# Patient Record
Sex: Male | Born: 1970 | Race: Black or African American | Hispanic: No | Marital: Single | Smoking: Current some day smoker
Health system: Southern US, Community
[De-identification: ages and names within clinical notes are randomized; demographics above are authoritative.]

## PROBLEM LIST (undated history)

## (undated) DIAGNOSIS — E7800 Pure hypercholesterolemia, unspecified: Secondary | ICD-10-CM

## (undated) DIAGNOSIS — I10 Essential (primary) hypertension: Secondary | ICD-10-CM

## (undated) HISTORY — PX: SHOULDER ARTHROSCOPY WITH OPEN ROTATOR CUFF REPAIR: SHX6092

---

## 2010-01-30 ENCOUNTER — Inpatient Hospital Stay (HOSPITAL_COMMUNITY): Admission: EM | Admit: 2010-01-30 | Discharge: 2010-02-01 | Payer: Self-pay | Admitting: Emergency Medicine

## 2010-09-07 IMAGING — CR DG CHEST 2V
2 series · 2 of 2 positions shown · non-contrast
Comparison: None.

CLINICAL DATA: Preoperative respiratory film.

CHEST - 2 VIEW

[w chest pa]
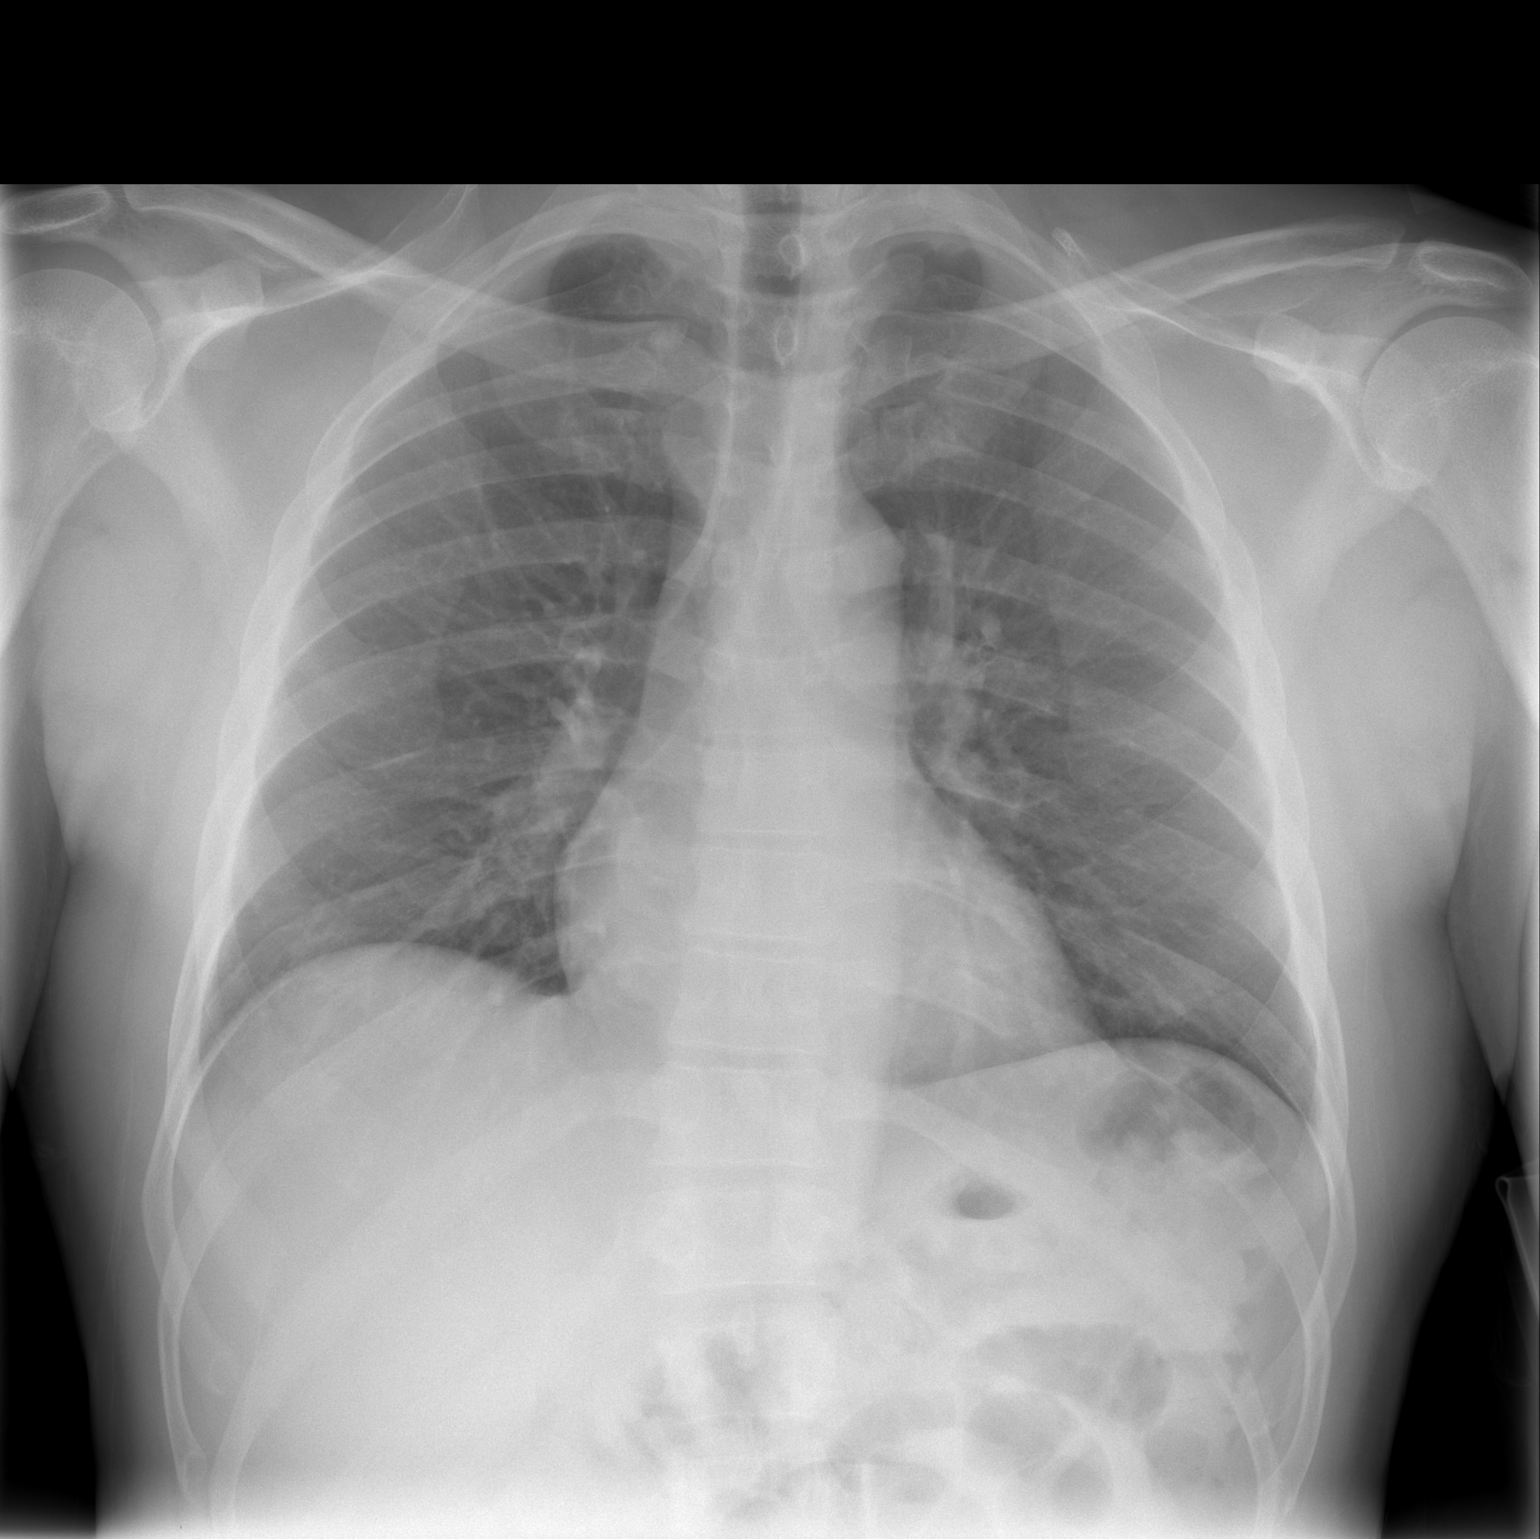

[w chest lat]
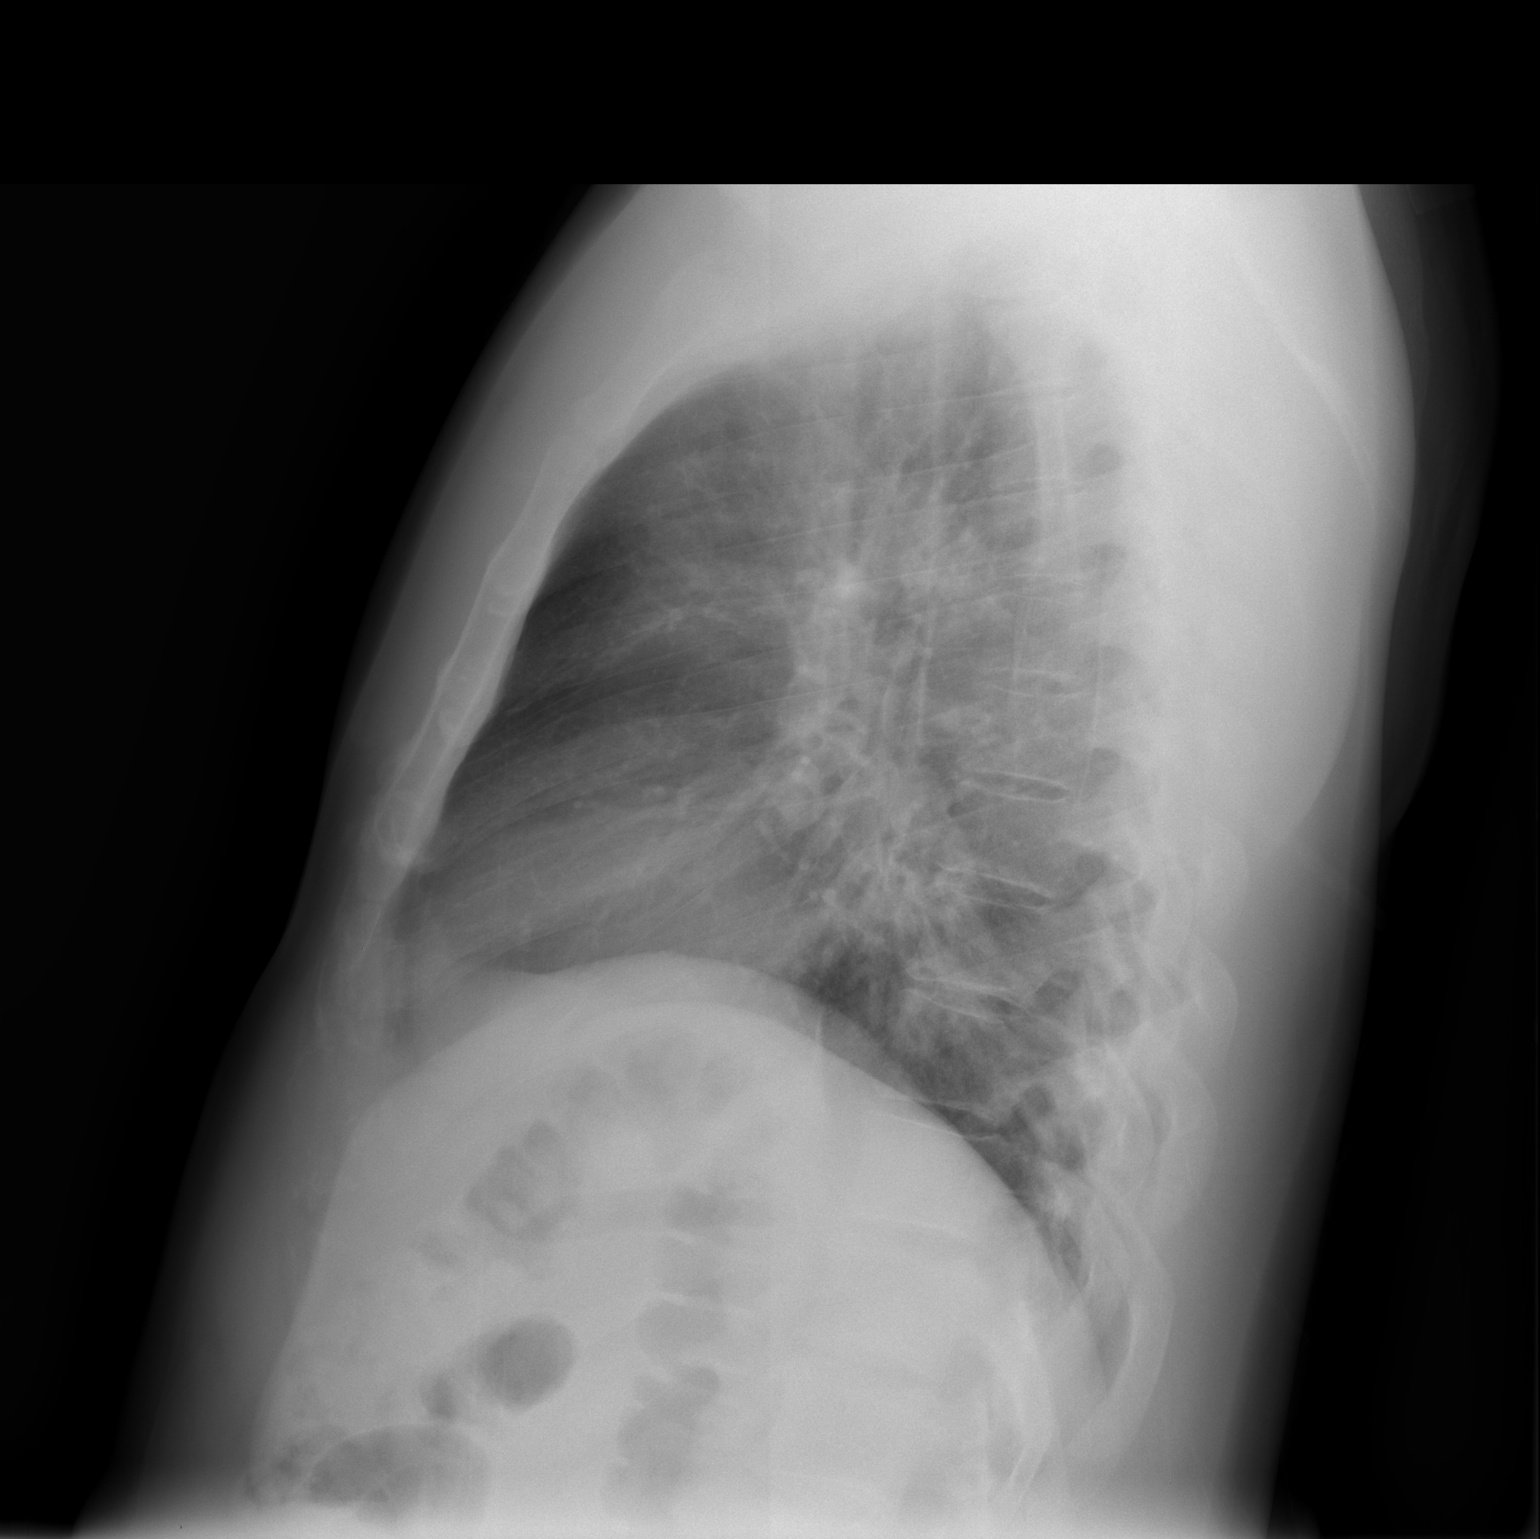

[2 of 2 positions shown; findings below may reference images not displayed]

FINDINGS: Lungs clear.  No pleural effusion.  Heart size normal.
IMPRESSION: No acute disease.

## 2011-02-23 LAB — URINALYSIS, ROUTINE W REFLEX MICROSCOPIC
Bilirubin Urine: NEGATIVE
Glucose, UA: NEGATIVE mg/dL
Hgb urine dipstick: NEGATIVE
Ketones, ur: NEGATIVE mg/dL
Nitrite: NEGATIVE
Protein, ur: NEGATIVE mg/dL
Specific Gravity, Urine: 1.013 (ref 1.005–1.030)
Urobilinogen, UA: 1 mg/dL (ref 0.0–1.0)
pH: 6.5 (ref 5.0–8.0)

## 2011-02-23 LAB — DIFFERENTIAL
Basophils Absolute: 0 10*3/uL (ref 0.0–0.1)
Eosinophils Absolute: 0.2 10*3/uL (ref 0.0–0.7)
Monocytes Absolute: 0.7 10*3/uL (ref 0.1–1.0)
Neutro Abs: 3.8 10*3/uL (ref 1.7–7.7)
Neutrophils Relative %: 53 % (ref 43–77)

## 2011-02-23 LAB — CBC
HCT: 38.8 % — ABNORMAL LOW (ref 39.0–52.0)
Hemoglobin: 13 g/dL (ref 13.0–17.0)
MCHC: 33.6 g/dL (ref 30.0–36.0)
MCV: 90.1 fL (ref 78.0–100.0)
RBC: 4.31 MIL/uL (ref 4.22–5.81)
RDW: 13.9 % (ref 11.5–15.5)

## 2011-02-23 LAB — BASIC METABOLIC PANEL
Calcium: 8.8 mg/dL (ref 8.4–10.5)
Creatinine, Ser: 1.1 mg/dL (ref 0.4–1.5)
GFR calc Af Amer: >60 mL/min (ref >60)

## 2022-09-01 DIAGNOSIS — I639 Cerebral infarction, unspecified: Secondary | ICD-10-CM

## 2022-09-01 HISTORY — DX: Cerebral infarction, unspecified: I63.9

## 2022-09-19 ENCOUNTER — Other Ambulatory Visit: Payer: Self-pay

## 2022-09-19 ENCOUNTER — Encounter (HOSPITAL_COMMUNITY): Payer: Self-pay

## 2022-09-19 DIAGNOSIS — I639 Cerebral infarction, unspecified: Secondary | ICD-10-CM | POA: Diagnosis not present

## 2022-09-19 DIAGNOSIS — R209 Unspecified disturbances of skin sensation: Secondary | ICD-10-CM | POA: Diagnosis not present

## 2022-09-19 DIAGNOSIS — M79605 Pain in left leg: Secondary | ICD-10-CM | POA: Diagnosis present

## 2022-09-19 DIAGNOSIS — Z5321 Procedure and treatment not carried out due to patient leaving prior to being seen by health care provider: Secondary | ICD-10-CM | POA: Insufficient documentation

## 2022-09-19 NOTE — ED Triage Notes (Signed)
Pt reports left leg pain. Pt reports that his back calf is numb. This has been ongoing since his stroke on 9/29.Pt suppose to f/u but have not been able to get an appt. Pt states "my left side just hurting."

## 2022-09-20 ENCOUNTER — Emergency Department (HOSPITAL_COMMUNITY)
Admission: EM | Admit: 2022-09-20 | Discharge: 2022-09-20 | Discharge status: Against Medical Advice | Payer: BC Managed Care – PPO | Attending: Emergency Medicine | Admitting: Emergency Medicine

## 2022-09-20 HISTORY — DX: Pure hypercholesterolemia, unspecified: E78.00

## 2022-09-20 HISTORY — DX: Essential (primary) hypertension: I10

## 2022-10-16 ENCOUNTER — Ambulatory Visit: Payer: BC Managed Care – PPO | Admitting: Internal Medicine

## 2022-10-16 ENCOUNTER — Encounter: Payer: Self-pay | Admitting: Internal Medicine

## 2022-10-16 VITALS — BP 135/93 | HR 92 | Ht 72.0 in | Wt 203.2 lb

## 2022-10-16 DIAGNOSIS — E782 Mixed hyperlipidemia: Secondary | ICD-10-CM

## 2022-10-16 DIAGNOSIS — I469 Cardiac arrest, cause unspecified: Secondary | ICD-10-CM

## 2022-10-16 DIAGNOSIS — I10 Essential (primary) hypertension: Secondary | ICD-10-CM | POA: Insufficient documentation

## 2022-10-16 DIAGNOSIS — I639 Cerebral infarction, unspecified: Secondary | ICD-10-CM | POA: Insufficient documentation
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
## 2022-11-01 ENCOUNTER — Encounter: Payer: Self-pay | Admitting: Neurology

## 2022-11-01 ENCOUNTER — Ambulatory Visit (INDEPENDENT_AMBULATORY_CARE_PROVIDER_SITE_OTHER): Payer: BC Managed Care – PPO | Admitting: Neurology

## 2022-11-01 VITALS — BP 133/93 | HR 91 | Ht 72.0 in | Wt 205.3 lb

## 2022-11-01 DIAGNOSIS — M792 Neuralgia and neuritis, unspecified: Secondary | ICD-10-CM | POA: Diagnosis not present

## 2022-11-01 DIAGNOSIS — M21372 Foot drop, left foot: Secondary | ICD-10-CM

## 2022-11-01 DIAGNOSIS — I63542 Cerebral infarction due to unspecified occlusion or stenosis of left cerebellar artery: Secondary | ICD-10-CM

## 2022-11-01 MED ORDER — ASPIRIN 81 MG PO CHEW: 81.0000 mg | CHEWABLE_TABLET | Freq: Every morning | ORAL | 3 refills | Status: AC

## 2022-11-01 MED ORDER — LIDOCAINE 3 % EX CREA: TOPICAL_CREAM | CUTANEOUS | 0 refills | Status: DC
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
## 2022-11-01 NOTE — Patient Instructions (Signed)
Continue current medication including aspirin and high-dose statin Continue with duloxetine and gabapentin for neuropathic pain Continue with physical therapy Trial of lidocaine cream EMG nerve conduction study of the left lower extremity Follow-up in 6 months or sooner if worse

## 2022-11-02 ENCOUNTER — Ambulatory Visit: Payer: BC Managed Care – PPO

## 2022-11-02 DIAGNOSIS — I469 Cardiac arrest, cause unspecified: Secondary | ICD-10-CM

## 2022-11-02 DIAGNOSIS — I10 Essential (primary) hypertension: Secondary | ICD-10-CM

## 2022-11-02 DIAGNOSIS — I639 Cerebral infarction, unspecified: Secondary | ICD-10-CM

## 2022-11-14 ENCOUNTER — Ambulatory Visit: Payer: BC Managed Care – PPO | Admitting: Internal Medicine

## 2022-11-14 ENCOUNTER — Ambulatory Visit: Payer: Self-pay | Admitting: Internal Medicine

## 2022-11-22 ENCOUNTER — Ambulatory Visit: Payer: BC Managed Care – PPO | Admitting: Internal Medicine

## 2022-11-22 ENCOUNTER — Encounter: Payer: Self-pay | Admitting: Internal Medicine

## 2022-12-06 ENCOUNTER — Other Ambulatory Visit: Payer: Self-pay | Admitting: Neurology

## 2022-12-25 NOTE — Progress Notes (Signed)
No show

## 2022-12-28 ENCOUNTER — Ambulatory Visit: Payer: BC Managed Care – PPO | Admitting: Neurology

## 2022-12-28 VITALS — BP 133/87 | HR 83 | Ht 72.0 in | Wt 205.0 lb

## 2022-12-28 DIAGNOSIS — M21372 Foot drop, left foot: Secondary | ICD-10-CM

## 2022-12-28 DIAGNOSIS — R29898 Other symptoms and signs involving the musculoskeletal system: Secondary | ICD-10-CM

## 2022-12-28 DIAGNOSIS — R200 Anesthesia of skin: Secondary | ICD-10-CM | POA: Diagnosis not present

## 2022-12-28 DIAGNOSIS — M5416 Radiculopathy, lumbar region: Secondary | ICD-10-CM | POA: Diagnosis not present

## 2022-12-28 DIAGNOSIS — R292 Abnormal reflex: Secondary | ICD-10-CM

## 2022-12-28 NOTE — Patient Instructions (Addendum)
MRI lumbar spine for L5 radiculopathy

## 2022-12-28 NOTE — Progress Notes (Signed)
Discussed results today with patient. He is a 52 year old with low back pain and foot drop. Improving with PT, nerves still hurting, coming from the back to the back of the thigh to the top of the foot. Starts buttocks, hurts worst at the back of the thigh and buttocks, strength is improving, but still considerable numbness, tingling top of foot, weakness in the leg. Denies other leg and arms no symtoms, happened right after 2 days in the ICU. Slowly improving. Swelling improving as well at first felt like it was dead and now starting to feel it more and slowly improve. Now walking, and going up and down steps, riding bike. However still experiencing what appears to be L5 radiculopathy supported by EMG/NCS findings. Discussed with patient and he agrees to MRI lumbar spine for intervention. Explained and showed him myotomes and dermatomes on computer and explained via a spine model.   Limited examination:  Left foot DF 4/5 Left foot Mild weakness eversion and inversion.  Absent AJ left 1+ right AJ Patellars 1-2+ symmetrical No clonus No babinski Antalgic gait  Orders Placed This Encounter  Procedures   MR LUMBAR SPINE WO CONTRAST   I spent over 30 minutes of face-to-face and non-face-to-face time with patient on the  1. Lumbar radiculopathy   2. Left foot drop   3. Foot drop, left   4. Left leg numbness   5. Numbness of left foot   6. Weakness of foot, left   7. Abnormal DTR (deep tendon reflex)    diagnosis.  This included previsit chart review, lab review, study review, order entry, electronic health record documentation, patient education on the different diagnostic and therapeutic options, counseling and coordination of care, risks and benefits of management, compliance, or risk factor reduction. This does not include time spent on emg/ncs.

## 2023-01-01 NOTE — Progress Notes (Signed)
Full Name: Kenneth Morrison Gender: Male MRN #: [id number] Date of Birth: Dec 07, 1970    Visit Date: 12/28/2022 08:04 Age: 52 Years Examining Physician: Dr. Sarina Ill Referring Physician: Dr. Alric Ran Height: 6 feet 0 inch  History: 52 year old male with low back pain, left radicular symptoms and left foot drop with weakness of DF, eversion and inversion of foot and absent left AJ reflex.   Summary; EMG/NCS was performed on the left lower extremity.  The left peroneal motor nerve showed no response.  The left tibial motor nerve showed delayed latency (6.7 ms, normal less than 5.8) and reduced amplitude (0.3 mV, normal greater than 4) and reduced velocity (31 m/s, normal greater than 41).  The left sural sensory nerve showed slightly decreased amplitude (5 V, normal greater than 6).  The left superficial peroneal sensory nerve was within normal limits.  The left F waves showed delayed latency (63.2 ms, normal less than 56).  EMG needle study showed increased spontaneous activity, increased amplitude, polyphasic motor units and reduced recruitment of the left tibialis anterior.  The left gastrocnemius showed increased spontaneous activity, increased duration, polyphasic motor units and reduced recruitment.  The left biceps femoris long head showed increase spontaneous activity, increased duration, polyphasic motor units and reduced motor unit recruitment.  The left peroneus longus muscle showed increased spontaneous activity, increased duration, polyphasic motor units and reduced motor unit recruitment.  There were CRDs seen in the left gluteus maximus otherwise patient could not tolerate needle study there.  Patient could also not tolerate needle study in the paraspinal muscles or left tibialis posterior muscle. All remaining nerves and remaining muscles (as indicated in the following tables) were within normal limits.       Conclusion: There is electrophysiologic evidence of lumbar  radiculopathy of the left leg as seen by the abnormal motor conductions and prolonged F wave but essentially normal sensory conductions of the left leg EMG needle study of the left leg showed acute/ongoing denervation and chronic neurogenic changes in distal and proximal muscles that share L5,S1 innervation. 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 See summary note and meeting with patient in separate note, MRI lumbar spine ordered with Dr. Jabier Mutton permission.   Sarina Ill M.D.  [facility] [address] [place], [place] [address] Tel: [phone] Fax: [phone]  Verbal informed consent was obtained from the patient, patient was informed of potential risk of procedure, including bruising, bleeding, hematoma formation, infection, muscle weakness, muscle pain, numbness, among others.        [place]    Nerve / Sites Muscle Latency Ref. Amplitude Ref. Rel Amp Segments Distance Velocity Ref. Area    ms ms mV mV %  cm m/s m/s mVms  L Peroneal - EDB     Ankle EDB NR ?6.5 NR ?2.0 NR Ankle - EDB 9   NR     Fib head EDB      Fib head - Ankle   ?44      Pop fossa EDB      Pop fossa - Fib head   ?44          Pop fossa - Ankle      L Tibial - AH     Ankle AH 6.7 ?5.8 0.3 ?4.0 100 Ankle - AH 9   0.6     Pop fossa AH 20.7  0.1  42.2 Pop  fossa - Ankle 44 31 ?41 0.4           SNC    Nerve / Sites Rec. Site Peak Lat Ref.  Amp Ref. Segments Distance    ms ms V V  cm  L Sural - Ankle (Calf)     Calf Ankle 2.5 ?4.4 5 ?6 Calf - Ankle 14  L Superficial peroneal - Ankle     Lat leg Ankle 2.3 ?4.4 7 ?6 Lat leg - Ankle 14           F  Wave    Nerve F Lat Ref.   ms ms  L Tibial - AH 63.2 ?56.0            EMG Summary Table    Spontaneous MUAP Recruitment  Muscle IA Fib PSW Fasc Other Amp Dur. Poly Pattern  L. Vastus medialis Normal None None None _______ Normal Normal  Normal Normal  L. Tibialis anterior Normal None 4+ None _______ Increased Normal 3+ Reduced  L. Tibialis posterior Normal None None None _______ Normal could not tolerate attempted Normal Normal  L. Vastus medialis Normal None None None _______ Normal Normal Normal Normal  L. Gastrocnemius (Medial head) Normal None 3+ None _______ Normal Increased 2+ Reduced  L. Biceps femoris (long head) Normal None 3+ None _______ Normal Increased 2+ Reduced  L. Lumbar paraspinals (low) Normal None None None _______ Normal Could not tolerate Normal Normal  L. Gluteus maximus Normal None None None CRDs Normal could not tolerate Normal Normal  L. Peroneus longus Normal None 3+ None _______ Normal Increased 2+ Reduced     

## 2023-01-01 NOTE — Procedures (Signed)
Full Name: Kenneth Morrison Gender: Male MRN #: [id number] Date of Birth: Dec 07, 1970    Visit Date: 12/28/2022 08:04 Age: 52 Years Examining Physician: Dr. Sarina Ill Referring Physician: Dr. Alric Ran Height: 6 feet 0 inch  History: 52 year old male with low back pain, left radicular symptoms and left foot drop with weakness of DF, eversion and inversion of foot and absent left AJ reflex.   Summary; EMG/NCS was performed on the left lower extremity.  The left peroneal motor nerve showed no response.  The left tibial motor nerve showed delayed latency (6.7 ms, normal less than 5.8) and reduced amplitude (0.3 mV, normal greater than 4) and reduced velocity (31 m/s, normal greater than 41).  The left sural sensory nerve showed slightly decreased amplitude (5 V, normal greater than 6).  The left superficial peroneal sensory nerve was within normal limits.  The left F waves showed delayed latency (63.2 ms, normal less than 56).  EMG needle study showed increased spontaneous activity, increased amplitude, polyphasic motor units and reduced recruitment of the left tibialis anterior.  The left gastrocnemius showed increased spontaneous activity, increased duration, polyphasic motor units and reduced recruitment.  The left biceps femoris long head showed increase spontaneous activity, increased duration, polyphasic motor units and reduced motor unit recruitment.  The left peroneus longus muscle showed increased spontaneous activity, increased duration, polyphasic motor units and reduced motor unit recruitment.  There were CRDs seen in the left gluteus maximus otherwise patient could not tolerate needle study there.  Patient could also not tolerate needle study in the paraspinal muscles or left tibialis posterior muscle. All remaining nerves and remaining muscles (as indicated in the following tables) were within normal limits.       Conclusion: There is electrophysiologic evidence of lumbar  radiculopathy of the left leg as seen by the abnormal motor conductions and prolonged F wave but essentially normal sensory conductions of the left leg EMG needle study of the left leg showed acute/ongoing denervation and chronic neurogenic changes in distal and proximal muscles that share L5,S1 innervation. 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 See summary note and meeting with patient in separate note, MRI lumbar spine ordered with Dr. Jabier Mutton permission.   Sarina Ill M.D.  [facility] [address] [place], [place] [address] Tel: [phone] Fax: [phone]  Verbal informed consent was obtained from the patient, patient was informed of potential risk of procedure, including bruising, bleeding, hematoma formation, infection, muscle weakness, muscle pain, numbness, among others.        [place]    Nerve / Sites Muscle Latency Ref. Amplitude Ref. Rel Amp Segments Distance Velocity Ref. Area    ms ms mV mV %  cm m/s m/s mVms  L Peroneal - EDB     Ankle EDB NR ?6.5 NR ?2.0 NR Ankle - EDB 9   NR     Fib head EDB      Fib head - Ankle   ?44      Pop fossa EDB      Pop fossa - Fib head   ?44          Pop fossa - Ankle      L Tibial - AH     Ankle AH 6.7 ?5.8 0.3 ?4.0 100 Ankle - AH 9   0.6     Pop fossa AH 20.7  0.1  42.2 Pop  fossa - Ankle 44 31 ?41 0.4           SNC    Nerve / Sites Rec. Site Peak Lat Ref.  Amp Ref. Segments Distance    ms ms V V  cm  L Sural - Ankle (Calf)     Calf Ankle 2.5 ?4.4 5 ?6 Calf - Ankle 14  L Superficial peroneal - Ankle     Lat leg Ankle 2.3 ?4.4 7 ?6 Lat leg - Ankle 14           F  Wave    Nerve F Lat Ref.   ms ms  L Tibial - AH 63.2 ?56.0            EMG Summary Table    Spontaneous MUAP Recruitment  Muscle IA Fib PSW Fasc Other Amp Dur. Poly Pattern  L. Vastus medialis Normal None None None _______ Normal Normal  Normal Normal  L. Tibialis anterior Normal None 4+ None _______ Increased Normal 3+ Reduced  L. Tibialis posterior Normal None None None _______ Normal could not tolerate attempted Normal Normal  L. Vastus medialis Normal None None None _______ Normal Normal Normal Normal  L. Gastrocnemius (Medial head) Normal None 3+ None _______ Normal Increased 2+ Reduced  L. Biceps femoris (long head) Normal None 3+ None _______ Normal Increased 2+ Reduced  L. Lumbar paraspinals (low) Normal None None None _______ Normal Could not tolerate Normal Normal  L. Gluteus maximus Normal None None None CRDs Normal could not tolerate Normal Normal  L. Peroneus longus Normal None 3+ None _______ Normal Increased 2+ Reduced

## 2023-01-09 ENCOUNTER — Ambulatory Visit (INDEPENDENT_AMBULATORY_CARE_PROVIDER_SITE_OTHER): Payer: BC Managed Care – PPO

## 2023-01-09 DIAGNOSIS — R29898 Other symptoms and signs involving the musculoskeletal system: Secondary | ICD-10-CM | POA: Diagnosis not present

## 2023-01-09 DIAGNOSIS — M5416 Radiculopathy, lumbar region: Secondary | ICD-10-CM

## 2023-01-09 DIAGNOSIS — R292 Abnormal reflex: Secondary | ICD-10-CM

## 2023-01-09 DIAGNOSIS — M21372 Foot drop, left foot: Secondary | ICD-10-CM

## 2023-01-09 DIAGNOSIS — R200 Anesthesia of skin: Secondary | ICD-10-CM

## 2023-01-10 NOTE — Progress Notes (Addendum)
error 

## 2023-01-15 ENCOUNTER — Telehealth: Payer: Self-pay | Admitting: Neurology

## 2023-01-15 NOTE — Telephone Encounter (Signed)
I saw patient for left foot drop. Emg/ncs was very convincing for left l5 radiculopathy. When I saw him his foot drop was significantly improved but he did report L5 radicular pain in the left leg. MRI lumbar spine showed right > left L5 root impingement (see below). I tried calling tonight to discuss, to see if he continues to improve with his left foot drop, if we should continue to monitor clinically since he is improving tremendously or send him to neurosurgery. 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  The disc protrusion encroaches upon the left L5 nerve root without causing definite compression. Other levels appear normal.    EMG/NCS: There is electrophysiologic evidence of lumbar radiculopathy of the left leg as seen by the abnormal motor conductions and prolonged F wave but essentially normal sensory conductions of the left leg EMG needle study of the left leg showed acute/ongoing denervation and chronic neurogenic changes in distal and proximal muscles that share L5,S1 innervation.

## 2023-01-16 ENCOUNTER — Other Ambulatory Visit: Payer: Self-pay | Admitting: Neurology

## 2023-01-16 ENCOUNTER — Telehealth: Payer: Self-pay | Admitting: Neurology

## 2023-01-16 DIAGNOSIS — M5417 Radiculopathy, lumbosacral region: Secondary | ICD-10-CM

## 2023-01-16 DIAGNOSIS — M21372 Foot drop, left foot: Secondary | ICD-10-CM

## 2023-01-16 NOTE — Telephone Encounter (Signed)
Spoke with Dr. Jaynee Eagles,  will refer to Dr. Reatha Armour with [facility] for evaluation - L5 impingement.  Pt informed and ok to proceed with referral.  He is to receive call from there office, if he does not hear from them in a week to let us know.  I gave him there phone # as well.  He verbalized understanding.

## 2023-01-16 NOTE — Telephone Encounter (Addendum)
I called pt back he stated that he is the same as when he came in for evaluation.  Still with pain/swelling from L buttock to toes.  I relayed will check with Dr. Jaynee Eagles and then will call back.  He appreciated call back.

## 2023-01-16 NOTE — Telephone Encounter (Signed)
See other phone note

## 2023-01-16 NOTE — Telephone Encounter (Signed)
Pt is needing to speak to RN when available.

## 2023-01-16 NOTE — Telephone Encounter (Signed)
Patient still with left-sided back pain radiating to the left foot with foot drop, emg c/w acute L5 radic and MRI showed stenosis on the left at L5 will send to Dr. 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  The disc protrusion encroaches upon the left L5 nerve root without causing definite compression. Other levels appear normal.     EMG/NCS: There is electrophysiologic evidence of lumbar radiculopathy of the left leg as seen by the abnormal motor conductions and prolonged F wave but essentially normal sensory conductions of the left leg EMG needle study of the left leg showed acute/ongoing denervation and chronic neurogenic changes in distal and proximal muscles that share L5,S1 innervation.

## 2023-01-17 ENCOUNTER — Telehealth: Payer: Self-pay | Admitting: Neurology

## 2023-01-17 NOTE — Telephone Encounter (Signed)
Referral sent to Dr. Reatha Armour @ [facility], phone # [phone].

## 2023-01-17 NOTE — Telephone Encounter (Signed)
Scheduled 01/24/23 at 10:15am with Dr. Reatha Armour

## 2023-01-25 ENCOUNTER — Telehealth: Payer: Self-pay | Admitting: Neurology

## 2023-01-25 NOTE — Telephone Encounter (Signed)
LVM advising pt of appt date and time.

## 2023-01-25 NOTE — Telephone Encounter (Signed)
Megan, neurosurgery wants [place] to check his peroneal again and the anterior tib for a peroneal neuropathy. Think we ould get the patient back, no charge, and see if we can find a peroneal neuropathy? The EMG was SO convincing for an L5 but the MRI didn;t show anything.Marland KitchenMarland Kitchen

## 2023-02-22 ENCOUNTER — Ambulatory Visit (INDEPENDENT_AMBULATORY_CARE_PROVIDER_SITE_OTHER): Payer: BC Managed Care – PPO | Admitting: Neurology

## 2023-02-22 VITALS — BP 134/84 | HR 77 | Ht 72.0 in

## 2023-02-22 DIAGNOSIS — M79605 Pain in left leg: Secondary | ICD-10-CM | POA: Diagnosis not present

## 2023-02-22 DIAGNOSIS — R29898 Other symptoms and signs involving the musculoskeletal system: Secondary | ICD-10-CM | POA: Diagnosis not present

## 2023-02-22 DIAGNOSIS — M21372 Foot drop, left foot: Secondary | ICD-10-CM | POA: Diagnosis not present

## 2023-02-22 DIAGNOSIS — M5417 Radiculopathy, lumbosacral region: Secondary | ICD-10-CM | POA: Diagnosis not present

## 2023-02-22 NOTE — Progress Notes (Addendum)
Addendum 02/26/2023: Patient saw neurosurgery who questioned lumbar radiculopathy due to MRI. Today we have him back to verify results. He DEFINITELY haS acute/ongoing denervation ABOVE the peroneal/fibular head indication L5 radiculopathy of the left leg. His clinical picture also fits: Hurts from the buttocks down the back of the thight to the side side of the lower leg to the top of the left foot, weakness in dorsiflexion of the left foot and inversion of the left foot (inversion is Tibial not peroneal) and also he has reduced left AJ reflex. His left peroneal motor nerve is absent. His right peroneal motor  and sensory nerves  is normal(asymptomatic right leg). 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 I discussed with Dr April Manson and we decided to order left L5 ESI(Dr. April Manson) and imaging at the fibular head (Dr. April Manson) and Lumbar CT myelogram( Dr. Jaynee Eagles).  See addendum NCS/EMG completed 02-22-2023 at end of the this original emg/ncs confirming results.   Please send to [facility] for CT myelogram of lumbar spine for left L5 Radicluopathy clearly seen on emg/ncs c/w clinical picture but not appreciated on MRI lumbar spine  Orders Placed This Encounter  Procedures   DG INJECT DIAG/THERA/INC NEEDLE/CATH/PLC EPI/LUMB/SAC W/IMG   CT LUMBAR SPINE W CONTRAST   DG Myelogram Lumbar   I spent over 20 minutes of face-to-face and non-face-to-face time with patient on the  1. Lumbosacral radiculopathy at L5   2. Left foot drop   3. Left leg pain   4. Left leg weakness    diagnosis.  This included previsit chart review, lab review, study review, order entry, electronic health record documentation, patient education on the different diagnostic and therapeutic options, counseling and  coordination of care, risks and benefits of management, compliance, or risk factor reduction. This does not include time spent on emg/ncs.   EMG/NCS 12/29/2023:              Full Name:      Kenneth Morrison           Gender:          Male MRN #:            [id number]      Date of Birth: 1971/06/01    Visit Date:      12/28/2022 08:04 Age:    52 Years Examining Physician:          Dr. Sarina Ill Referring Physician: Dr. Alric Ran Height:            6 feet 0 inch   History: 52 year old male with low back pain, left radicular symptoms and left foot drop with weakness of DF, eversion and inversion of foot and absent left AJ reflex.    Summary; EMG/NCS was performed on the left lower extremity.  The left peroneal motor nerve showed no response.  The left tibial motor nerve showed delayed latency (6.7 ms, normal less than 5.8) and reduced amplitude (0.3 mV, normal greater than 4) and reduced velocity (31 m/s, normal greater than 41).  The left sural sensory nerve showed slightly decreased amplitude (5 V, normal greater than 6).  The left superficial peroneal sensory nerve was within normal limits.  The left F waves showed delayed latency (63.2 ms, normal less than 56).  EMG  needle study showed increased spontaneous activity, increased amplitude, polyphasic motor units and reduced recruitment of the left tibialis anterior.  The left gastrocnemius showed increased spontaneous activity, increased duration, polyphasic motor units and reduced recruitment.  The left biceps femoris long head showed increase spontaneous activity, increased duration, polyphasic motor units and reduced motor unit recruitment.  The left peroneus longus muscle showed increased spontaneous activity, increased duration, polyphasic motor units and reduced motor unit recruitment.  There were CRDs seen in the left gluteus maximus otherwise patient could not tolerate needle study there.  Patient could also not tolerate needle study  in the paraspinal muscles or left tibialis posterior muscle. All remaining nerves and remaining muscles (as indicated in the following tables) were within normal limits.        Conclusion: There is electrophysiologic evidence of lumbar radiculopathy of the left leg as seen by the abnormal motor conductions and prolonged F wave but essentially normal sensory conductions of the left leg EMG needle study of the left leg showed acute/ongoing denervation and chronic neurogenic changes in distal and proximal muscles that share L5,S1 innervation. 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 See summary note and meeting with patient in separate note, MRI lumbar spine ordered with Dr. Jabier Mutton permission.    Sarina Ill M.D.   [facility] [address], [place], [place] [address] Tel: [phone] Fax: [phone]   Verbal informed consent was obtained from the patient, patient was informed of potential risk of procedure, including bruising, bleeding, hematoma formation, infection, muscle weakness, muscle pain, numbness, among others.           [place]    Nerve / Sites Muscle Latency Ref. Amplitude Ref. Rel Amp Segments Distance Velocity Ref. Area      ms ms mV mV %   cm m/s m/s mVms  L Peroneal - EDB     Ankle EDB NR ?6.5 NR ?2.0 NR Ankle - EDB 9     NR     Fib head EDB           Fib head - Ankle     ?44       Pop fossa EDB           Pop fossa - Fib head     ?44                  Pop fossa - Ankle          L Tibial - AH     Ankle AH 6.7 ?5.8 0.3 ?4.0 100 Ankle - AH 9     0.6     Pop fossa AH 20.7   0.1   42.2 Pop fossa - Ankle 44 31 ?41 0.4           SNC    Nerve / Sites Rec. Site Peak Lat Ref.  Amp Ref. Segments Distance      ms ms V V   cm  L Sural - Ankle (Calf)     Calf Ankle 2.5 ?4.4 5 ?6 Calf - Ankle 14  L Superficial peroneal - Ankle     Lat leg Ankle 2.3 ?4.4 7  ?6 Lat leg - Ankle 14           F  Wave    Nerve F Lat Ref.    ms ms  L Tibial - AH 63.2 ?56.0  EMG Summary Table      Spontaneous MUAP Recruitment  Muscle IA Fib PSW Fasc Other Amp Dur. Poly Pattern  L. Vastus medialis Normal None None None _______ Normal Normal Normal Normal  L. Tibialis anterior Normal None 4+ None _______ Increased Normal 3+ Reduced  L. Tibialis posterior Normal None None None _______ Normal could not tolerate attempted Normal Normal  L. Vastus medialis Normal None None None _______ Normal Normal Normal Normal  L. Gastrocnemius (Medial head) Normal None 3+ None _______ Normal Increased 2+ Reduced  L. Biceps femoris (long head) Normal None 3+ None _______ Normal Increased 2+ Reduced  L. Lumbar paraspinals (low) Normal None None None _______ Normal Could not tolerate Normal Normal  L. Gluteus maximus Normal None None None CRDs Normal could not tolerate Normal Normal  L. Peroneus longus Normal None 3+ None _______ Normal Increased 2+ Reduced              Addendum: 02/26/2023;    [place]    Nerve / Sites Muscle Latency Ref. Amplitude Ref. Rel Amp Segments Distance Velocity Ref. Area    ms ms mV mV %  cm m/s m/s mVms  L Peroneal - EDB     Ankle EDB NR              Fib head EDB NR              Pop fossa EDB NR                        R Peroneal - EDB     Ankle EDB 5.8 ?6.5 3.1 ?2.0 100 Ankle - EDB 9   9.0     Fib head EDB 13.5  3.6  117 Fib head - Ankle 31 40 ?44 13.7     Pop fossa EDB 15.8  3.5  98.1 Pop fossa - Fib head 12 54 ?44 13.5  L Peroneal - Tib Ant     Fib Head Tib Ant NR              Pop fossa Tib Ant NR                      SNC    Nerve / Sites Rec. Site Peak Lat Ref.  Amp Ref. Segments Distance    ms ms V V  cm  R Superficial peroneal - Ankle     Lat leg Ankle 2.9 ?4.4 8 ?6 Lat leg - Ankle 14  L Superficial peroneal - Ankle     Lat leg Ankle 3.5 ?4.4 6 ?6 Lat leg - Ankle 14  L GENERIC NERVE - 2 Channel                                EMG Summary Table    Spontaneous MUAP Recruitment  Muscle IA Fib PSW Fasc Other Amp Dur. Poly Pattern  R. Biceps femoris (long head) Normal None +3 None _______ Normal Normal +3 Reduced  R. Gluteus maximus Normal None None None CRDS Normal Normal Normal Normal  R. Gluteus medius Normal None None None _______ Normal Normal Normal Normal  R. Lumbar paraspinals (low) Could not tolerate          R. Gastrocnemius (Medial head) Could not tolerate

## 2023-02-27 NOTE — Addendum Note (Signed)
Addended by: Sarina Ill B on: 02/27/2023 08:27 AM   Modules accepted: Level of Service

## 2023-02-28 ENCOUNTER — Telehealth: Payer: Self-pay | Admitting: Neurology

## 2023-02-28 NOTE — Telephone Encounter (Signed)
Dillon Bjork: [id number] exp. 02/28/23-03/29/23 sent to GI [id number]

## 2023-03-13 ENCOUNTER — Ambulatory Visit
Admission: RE | Admit: 2023-03-13 | Discharge: 2023-03-13 | Disposition: A | Discharge status: Home / Self Care | Payer: BC Managed Care – PPO | Source: Ambulatory Visit | Attending: Neurology | Admitting: Neurology

## 2023-03-13 DIAGNOSIS — M21372 Foot drop, left foot: Secondary | ICD-10-CM

## 2023-03-13 DIAGNOSIS — M79605 Pain in left leg: Secondary | ICD-10-CM

## 2023-03-13 DIAGNOSIS — R29898 Other symptoms and signs involving the musculoskeletal system: Secondary | ICD-10-CM

## 2023-03-13 DIAGNOSIS — M5417 Radiculopathy, lumbosacral region: Secondary | ICD-10-CM

## 2023-03-13 MED ORDER — MEPERIDINE HCL 50 MG/ML IJ SOLN: 50.0000 mg | Freq: Once | INTRAMUSCULAR | Status: DC | PRN

## 2023-03-13 MED ORDER — DIAZEPAM 5 MG PO TABS
10.0000 mg | ORAL_TABLET | Freq: Once | ORAL | Status: AC
  Administered 2023-03-13: 10 mg via ORAL

## 2023-03-13 MED ORDER — ONDANSETRON HCL 4 MG/2ML IJ SOLN: 4.0000 mg | Freq: Once | INTRAMUSCULAR | Status: DC | PRN

## 2023-03-13 MED ORDER — IOPAMIDOL (ISOVUE-M 200) INJECTION 41%
20.0000 mL | Freq: Once | INTRAMUSCULAR | Status: AC
  Administered 2023-03-13: 20 mL via INTRATHECAL

## 2023-03-13 NOTE — Discharge Instructions (Signed)

## 2023-03-15 ENCOUNTER — Telehealth: Payer: Self-pay | Admitting: Neurology
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
## 2023-03-17 NOTE — Telephone Encounter (Signed)
Please call the patient and discuss the results of his CT lumbar spine as noted above by Dr. Lucia Gaskins. Please advised him to contact us if the pain is not improving.

## 2023-03-19 NOTE — Telephone Encounter (Signed)
Returned call to the pt and went over the results. Pt stated that he is still experiencing pain and would like an appointment. I am routing this to Dr. Teresa Coombs to see how soon he needs the pt to come in

## 2023-03-19 NOTE — Telephone Encounter (Signed)
Please add him on the cancellation list.

## 2023-04-20 ENCOUNTER — Ambulatory Visit
Admission: RE | Admit: 2023-04-20 | Discharge: 2023-04-20 | Disposition: A | Discharge status: Home / Self Care | Payer: BC Managed Care – PPO | Source: Ambulatory Visit | Attending: Neurology | Admitting: Neurology

## 2023-04-20 DIAGNOSIS — M5417 Radiculopathy, lumbosacral region: Secondary | ICD-10-CM

## 2023-04-20 MED ORDER — IOPAMIDOL (ISOVUE-M 200) INJECTION 41%
1.0000 mL | Freq: Once | INTRAMUSCULAR | Status: AC
  Administered 2023-04-20: 1 mL via EPIDURAL

## 2023-04-20 MED ORDER — METHYLPREDNISOLONE ACETATE 40 MG/ML INJ SUSP (RADIOLOG
80.0000 mg | Freq: Once | INTRAMUSCULAR | Status: AC
  Administered 2023-04-20: 80 mg via EPIDURAL

## 2023-04-20 NOTE — Discharge Instructions (Signed)

## 2023-05-02 ENCOUNTER — Ambulatory Visit: Payer: BC Managed Care – PPO | Admitting: Neurology

## 2023-05-03 NOTE — Progress Notes (Signed)
Please call and inquire if patient is still having pain after steroid injection. If he is still having pain, please add him to my cancellation list for a sooner appointment.   Dr. Teresa Coombs

## 2023-05-08 ENCOUNTER — Telehealth: Payer: Self-pay

## 2023-05-08 NOTE — Telephone Encounter (Signed)
-----   Message from Windell Norfolk, MD sent at 05/03/2023  6:04 PM EDT ----- Please call and inquire if patient is still having pain after steroid injection. If he is still having pain, please add him to my cancellation list for a sooner appointment.   Dr. Teresa Coombs

## 2023-05-08 NOTE — Telephone Encounter (Signed)
LFT MSG TO CONTACT us REGARDING RESULT

## 2023-05-18 ENCOUNTER — Encounter: Payer: Self-pay | Admitting: Neurology

## 2023-05-18 NOTE — Progress Notes (Signed)
Patient was seen by Neurosurgery Dr. Jake Samples, plan is for surgery.
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
## 2023-11-13 ENCOUNTER — Other Ambulatory Visit (HOSPITAL_BASED_OUTPATIENT_CLINIC_OR_DEPARTMENT_OTHER): Payer: Self-pay

## 2023-11-13 ENCOUNTER — Encounter (HOSPITAL_BASED_OUTPATIENT_CLINIC_OR_DEPARTMENT_OTHER): Payer: Self-pay | Admitting: Family

## 2023-11-13 ENCOUNTER — Encounter: Payer: Self-pay | Admitting: Family

## 2023-11-13 ENCOUNTER — Ambulatory Visit (HOSPITAL_BASED_OUTPATIENT_CLINIC_OR_DEPARTMENT_OTHER): Payer: BC Managed Care – PPO | Admitting: Family

## 2023-11-13 VITALS — BP 120/84 | HR 65 | Resp 16 | Ht 72.0 in | Wt 223.7 lb

## 2023-11-13 DIAGNOSIS — Z8674 Personal history of sudden cardiac arrest: Secondary | ICD-10-CM

## 2023-11-13 DIAGNOSIS — I10 Essential (primary) hypertension: Secondary | ICD-10-CM

## 2023-11-13 DIAGNOSIS — R072 Precordial pain: Secondary | ICD-10-CM

## 2023-11-13 DIAGNOSIS — Z8673 Personal history of transient ischemic attack (TIA), and cerebral infarction without residual deficits: Secondary | ICD-10-CM

## 2023-11-13 DIAGNOSIS — E785 Hyperlipidemia, unspecified: Secondary | ICD-10-CM | POA: Diagnosis not present

## 2023-11-13 MED ORDER — ATORVASTATIN CALCIUM 40 MG PO TABS: 40.0000 mg | ORAL_TABLET | Freq: Every day | ORAL | 3 refills | Status: DC

## 2023-11-13 MED ORDER — ASPIRIN 81 MG PO TBEC
81.0000 mg | DELAYED_RELEASE_TABLET | Freq: Every day | ORAL | 3 refills | Status: AC
Start: 2023-11-13 — End: ?

## 2023-11-13 MED ORDER — AMLODIPINE BESYLATE 10 MG PO TABS
10.0000 mg | ORAL_TABLET | Freq: Every day | ORAL | 3 refills | Status: AC
Start: 2023-11-13 — End: ?

## 2023-11-13 MED ORDER — METOPROLOL TARTRATE 50 MG PO TABS: 50.0000 mg | ORAL_TABLET | Freq: Once | ORAL | 0 refills | Status: AC

## 2023-11-13 NOTE — Telephone Encounter (Signed)
Error

## 2023-11-13 NOTE — Patient Instructions (Addendum)
Medication Instructions:  Your physician has recommended you make the following change in your medication:   TAKE METOPROLOL TARTATE 50 MG 2 HOURS BEFORE CARDIAC CT.   *If you need a refill on your cardiac medications before your next appointment, please call your pharmacy*   Lab Work: PLEASE ASK PRIMARY TO DRAW LIPIDS AND CMP AND FAX RESULTS TO [phone].  If you have labs (blood work) drawn today and your tests are completely normal, you will receive your results only by: MyChart Message (if you have MyChart) OR A paper copy in the mail If you have any lab test that is abnormal or we need to change your treatment, we will call you to review the results.   Testing/Procedures:    Your cardiac CT will be scheduled at one of the below locations:   [facility] [address] [place], [place] [address] [phone]  OR  [facility] [address] [place], [place] [address] [phone]  OR   [facility] [address] [place], [place] [address] [phone]  If scheduled at [facility], please arrive at the [facility] and Children's Entrance (Entrance C2) of [facility] 30 minutes prior to test start time. You can use the FREE valet parking offered at entrance C (encouraged to control the heart rate for the test)  Proceed to the [facility] Radiology Department (first floor) to check-in and test prep.  All radiology patients and guests should use entrance C2 at [facility], accessed from [facility], even though the hospital's physical address listed is [address]    If scheduled at [facility] or [facility], please arrive 15 mins early for check-in and test prep.  There is spacious parking and easy access to the radiology department from the [facility] entrance. Please enter  here and check-in with the desk attendant.   Please follow these instructions carefully (unless otherwise directed):  An IV will be required for this test and Nitroglycerin will be given.  Hold all erectile dysfunction medications at least 3 days (72 hrs) prior to test. (Ie viagra, cialis, sildenafil, tadalafil, etc)   On the Night Before the Test: Be sure to Drink plenty of water. Do not consume any caffeinated/decaffeinated beverages or chocolate 12 hours prior to your test. Do not take any antihistamines 12 hours prior to your test.  On the Day of the Test: Drink plenty of water until 1 hour prior to the test. Do not eat any food 1 hour prior to test. You may take your regular medications prior to the test.  Take metoprolol (Lopressor) two hours prior to test. If you take Furosemide/Hydrochlorothiazide/Spironolactone/Chlorthalidone, please HOLD on the morning of the test. Patients who wear a continuous glucose monitor MUST remove the device prior to scanning. FEMALES- please wear underwire-free bra if available, avoid dresses & tight clothing       After the Test: Drink plenty of water. After receiving IV contrast, you may experience a mild flushed feeling. This is normal. On occasion, you may experience a mild rash up to 24 hours after the test. This is not dangerous. If this occurs, you can take Benadryl 25 mg and increase your fluid intake. If you experience trouble breathing, this can be serious. If it is severe call 911 IMMEDIATELY. If it is mild, please call our office.  We will call to schedule your test 2-4  weeks out understanding that some insurance companies will need an authorization prior to the service being performed.   For more information and frequently asked questions, please visit our website : [email or web address]  For non-scheduling related questions, please contact the cardiac imaging nurse navigator should you have any questions/concerns: Cardiac  Imaging Nurse Navigators Direct Office Dial: [phone]   For scheduling needs, including cancellations and rescheduling, please call [place], [phone].   Follow-Up: At [facility], you and your health needs are our priority.  As part of our continuing mission to provide you with exceptional heart care, we have created designated Provider Care Teams.  These Care Teams include your primary Cardiologist (physician) and Advanced Practice Providers (APPs -  Physician Assistants and Nurse Practitioners) who all work together to provide you with the care you need, when you need it.  We recommend signing up for the patient portal called "MyChart".  Sign up information is provided on this After Visit Summary.  MyChart is used to connect with patients for Virtual Visits (Telemedicine).  Patients are able to view lab/test results, encounter notes, upcoming appointments, etc.  Non-urgent messages can be sent to your provider as well.   To learn more about what you can do with MyChart, go to [email or web address].    Your next appointment:   12 month(s)  Provider:   Gillian Shields, NP

## 2023-11-29 ENCOUNTER — Telehealth (HOSPITAL_COMMUNITY): Payer: Self-pay | Admitting: *Deleted

## 2023-11-29 NOTE — Telephone Encounter (Signed)
Reaching out to patient to offer assistance regarding upcoming cardiac imaging study; pt verbalizes understanding of appt date/time, parking situation and where to check in, pre-test NPO status and medications ordered, and verified current allergies; name and call back number provided for further questions should they arise  Larey Brick RN Navigator Cardiac Imaging [facility] [phone] office [phone] cell  Patient to take 25mg  metoprolol tartrate two hours prior to his cardiac CT scan. He is aware to arrive at 1 PM.

## 2023-11-30 ENCOUNTER — Ambulatory Visit (HOSPITAL_COMMUNITY)
Admission: RE | Admit: 2023-11-30 | Discharge: 2023-11-30 | Disposition: A | Discharge status: Home / Self Care | Payer: BC Managed Care – PPO | Source: Ambulatory Visit | Attending: Family

## 2023-11-30 DIAGNOSIS — R072 Precordial pain: Secondary | ICD-10-CM | POA: Diagnosis present

## 2023-11-30 MED ORDER — IOHEXOL 350 MG/ML SOLN
100.0000 mL | Freq: Once | INTRAVENOUS | Status: AC | PRN
  Administered 2023-11-30: 100 mL via INTRAVENOUS

## 2023-11-30 MED ORDER — NITROGLYCERIN 0.4 MG SL SUBL
0.8000 mg | SUBLINGUAL_TABLET | Freq: Once | SUBLINGUAL | Status: AC
  Administered 2023-11-30: 0.8 mg via SUBLINGUAL

## 2023-11-30 MED ORDER — NITROGLYCERIN 0.4 MG SL SUBL
SUBLINGUAL_TABLET | SUBLINGUAL | Status: AC
  Filled 2023-11-30: qty 2

## 2024-12-10 ENCOUNTER — Other Ambulatory Visit (HOSPITAL_BASED_OUTPATIENT_CLINIC_OR_DEPARTMENT_OTHER): Payer: Self-pay | Admitting: Family

## 2024-12-10 DIAGNOSIS — E785 Hyperlipidemia, unspecified: Secondary | ICD-10-CM

## 2024-12-10 DIAGNOSIS — Z8673 Personal history of transient ischemic attack (TIA), and cerebral infarction without residual deficits: Secondary | ICD-10-CM
